# Patient Record
Sex: Female | Born: 2000 | Race: Black or African American | Hispanic: No | Marital: Single | Smoking: Never smoker
Health system: Southern US, Community
[De-identification: ages and names within clinical notes are randomized; demographics above are authoritative.]

---
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
## 2022-01-31 ENCOUNTER — Emergency Department (HOSPITAL_COMMUNITY): Payer: Medicaid Other

## 2022-01-31 ENCOUNTER — Emergency Department (HOSPITAL_COMMUNITY)
Admission: EM | Admit: 2022-01-31 | Discharge: 2022-01-31 | Disposition: A | Discharge status: Home / Self Care | Payer: Medicaid Other | Attending: Emergency Medicine | Admitting: Emergency Medicine

## 2022-01-31 ENCOUNTER — Encounter (HOSPITAL_COMMUNITY): Payer: Self-pay

## 2022-01-31 ENCOUNTER — Other Ambulatory Visit: Payer: Self-pay

## 2022-01-31 DIAGNOSIS — R079 Chest pain, unspecified: Secondary | ICD-10-CM

## 2022-01-31 DIAGNOSIS — R0789 Other chest pain: Secondary | ICD-10-CM | POA: Insufficient documentation

## 2022-01-31 MED ORDER — IBUPROFEN 800 MG PO TABS
800.0000 mg | ORAL_TABLET | Freq: Once | ORAL | Status: AC
  Administered 2022-01-31: 800 mg via ORAL
  Filled 2022-01-31: qty 1

## 2022-01-31 NOTE — ED Provider Triage Note (Signed)
Emergency Medicine Provider Triage Evaluation Note  Nancy Hoffman , a 21 y.o. female  was evaluated in triage.  Pt complains of pain in her right chest   Review of Systems  Positive: pain Negative:fever  Physical Exam  BP 113/76   Pulse 69   Temp 98.2 F (36.8 C) (Oral)   Resp 17   Ht 5\' 8"  (1.727 m)   Wt 104.5 kg   LMP 01/29/2022   SpO2 91%   BMI 35.02 kg/m  Gen:   Awake, no distress   Resp:  Normal effort  MSK:   Moves extremities without difficulty  Other:    Medical Decision Making  Medically screening exam initiated at 6:59 PM.  Appropriate orders placed.  03/31/2022 Nancy Hoffman was informed that the remainder of the evaluation will be completed by another provider, this initial triage assessment does not replace that evaluation, and the importance of remaining in the ED until their evaluation is complete.     Ella Jubilee, Elson Areas 01/31/22 1900

## 2022-01-31 NOTE — Discharge Instructions (Signed)
Today you were evaluated for chest pain.  Your EKG was stable with no signs or symptoms of arrhythmias or heart attacks.  Your chest x-ray looked well with no signs of collapsed lung, masses, or infection.  Your lungs sounded clear on exam with no wheezing or signs of asthma.  You are low risk for a blood clot and are not recommended for further testing at this time.  Please follow-up with your primary care physician or cardiologist provided if symptoms do not improve in the next 3 to 5 days.  Attempt taking Motrin at home to see if it improves your symptoms.

## 2022-01-31 NOTE — ED Provider Notes (Signed)
[facility]-EMERGENCY DEPT Provider Note   CSN: [id number] Arrival date & time: 01/31/22  1647     History  Chief Complaint  Patient presents with   Chest Pain   Shortness of Breath    Nancy Hoffman is a 21 y.o. female.  Patient is a 21 year old female presenting for complaints of chest pain.  Patient admits to chest pain that she noticed upon waking this morning, located on the left chest border, radiating to the left upper extremity, with no stated shortness of breath.  Denies any fevers, chills, coughing.  Denies any previous history of DVT or PE, lower extremity swelling or calf tenderness, prolonged immobilization in the past 3 months, recent surgeries, or hormone therapy.  Denies any chest rashes or breast masses.  The history is provided by the patient. No language interpreter was used.  Chest Pain Associated symptoms: shortness of breath   Associated symptoms: no abdominal pain, no back pain, no cough, no fever, no palpitations and no vomiting   Shortness of Breath Associated symptoms: chest pain   Associated symptoms: no abdominal pain, no cough, no ear pain, no fever, no rash, no sore throat and no vomiting        Home Medications Prior to Admission medications   Not on File      Allergies    Patient has no known allergies.    Review of Systems   Review of Systems  Constitutional:  Negative for chills and fever.  HENT:  Negative for ear pain and sore throat.   Eyes:  Negative for pain and visual disturbance.  Respiratory:  Positive for shortness of breath. Negative for cough.   Cardiovascular:  Positive for chest pain. Negative for palpitations.  Gastrointestinal:  Negative for abdominal pain and vomiting.  Genitourinary:  Negative for dysuria and hematuria.  Musculoskeletal:  Negative for arthralgias and back pain.  Skin:  Negative for color change and rash.  Neurological:  Negative for seizures and syncope.  All other systems  reviewed and are negative.   Physical Exam Updated Vital Signs BP 116/71   Pulse 78   Temp 98.4 F (36.9 C) (Oral)   Resp 18   Ht 5\' 8"  (1.727 m)   Wt 104.5 kg   LMP 01/29/2022   SpO2 100%   BMI 35.02 kg/m  Physical Exam Vitals and nursing note reviewed.  Constitutional:      General: She is not in acute distress.    Appearance: She is well-developed.  HENT:     Head: Normocephalic and atraumatic.  Eyes:     Conjunctiva/sclera: Conjunctivae normal.  Cardiovascular:     Rate and Rhythm: Normal rate and regular rhythm.     Heart sounds: No murmur heard. Pulmonary:     Effort: Pulmonary effort is normal. No respiratory distress.     Breath sounds: Normal breath sounds.  Chest:     Chest wall: No mass or tenderness.  Abdominal:     Palpations: Abdomen is soft.     Tenderness: There is no abdominal tenderness.  Musculoskeletal:        General: No swelling.     Cervical back: Neck supple.  Skin:    General: Skin is warm and dry.     Capillary Refill: Capillary refill takes less than 2 seconds.     Findings: No rash.  Neurological:     Mental Status: She is alert and oriented to person, place, and time.     GCS: GCS  eye subscore is 4. GCS verbal subscore is 5. GCS motor subscore is 6.  Psychiatric:        Mood and Affect: Mood normal.     ED Results / Procedures / Treatments   Labs (all labs ordered are listed, but only abnormal results are displayed) Labs Reviewed  HCG, SERUM, QUALITATIVE    EKG EKG Interpretation  Date/Time:  Thursday January 31 2022 18:22:29 EDT Ventricular Rate:  74 PR Interval:  165 QRS Duration: 70 QT Interval:  396 QTC Calculation: 440 R Axis:   76 Text Interpretation: Sinus rhythm RSR' in V1 or V2, probably normal variant Borderline T wave abnormalities Confirmed by Edwin Dada ([id number]) on 01/31/2022 10:16:13 PM  Radiology DG Chest 2 View  Result Date: 01/31/2022 CLINICAL DATA:  Chest pain EXAM: CHEST - 2 VIEW COMPARISON:   08/17/2021 FINDINGS: The heart size and mediastinal contours are within normal limits. Both lungs are clear. The visualized skeletal structures are unremarkable. IMPRESSION: No active cardiopulmonary disease. Electronically Signed   By: Jasmine Pang M.D.   On: 01/31/2022 19:15    Procedures Procedures    Medications Ordered in ED Medications  ibuprofen (ADVIL) tablet 800 mg (800 mg Oral Given 01/31/22 2240)    ED Course/ Medical Decision Making/ A&P                           Medical Decision Making Amount and/or Complexity of Data Reviewed Labs: ordered.  Risk Prescription drug management.   56:46 PM  21 year old female presenting for complaints of chest pain.  Patient is resting comfortably in bed, afebrile, stable vital signs.  EKG as interpreted by myself demonstrates sinus rhythm.  No ST segment elevation or depression.  No T wave inversions.  Normal axis.  QTc 440.  Past x-ray demonstrates no pneumothorax.  No pneumonia.  Normal mediastinum with.  Normal heart borders.  Chest wall demonstrates no rashes.  No reproducible tenderness.  Patient PERC negative.  Doubt PE.  Motrin given. Pt declined pregnancy test stating she has not has sexual intercourse yet.  At this time I have low suspicion for acute any acute life-threatening etiologies of chest pain.  Patient recommended for close follow-up with primary care physician and/or cardiologist if symptoms do not improve.  Physician provided.  Patient in no distress and overall condition improved here in the ED. Detailed discussions were had with the patient regarding current findings, and need for close f/u with PCP or on call doctor. The patient has been instructed to return immediately if the symptoms worsen in any way for re-evaluation. Patient verbalized understanding and is in agreement with current care plan. All questions answered prior to discharge.         Final Clinical Impression(s) / ED Diagnoses Final diagnoses:   Chest pain, unspecified type    Rx / DC Orders ED Discharge Orders     None         Franne Forts, DO 01/31/22 2259

## 2022-01-31 NOTE — ED Triage Notes (Signed)
Patient states she began having constant  mid chest pain this AM with SOB and left ar

## 2023-01-15 IMAGING — CR DG CHEST 2V
2 series · 2 of 2 positions shown · non-contrast
Comparison: None.

CLINICAL DATA: Chest pain

EXAM:
CHEST - 2 VIEW

[chest pa]
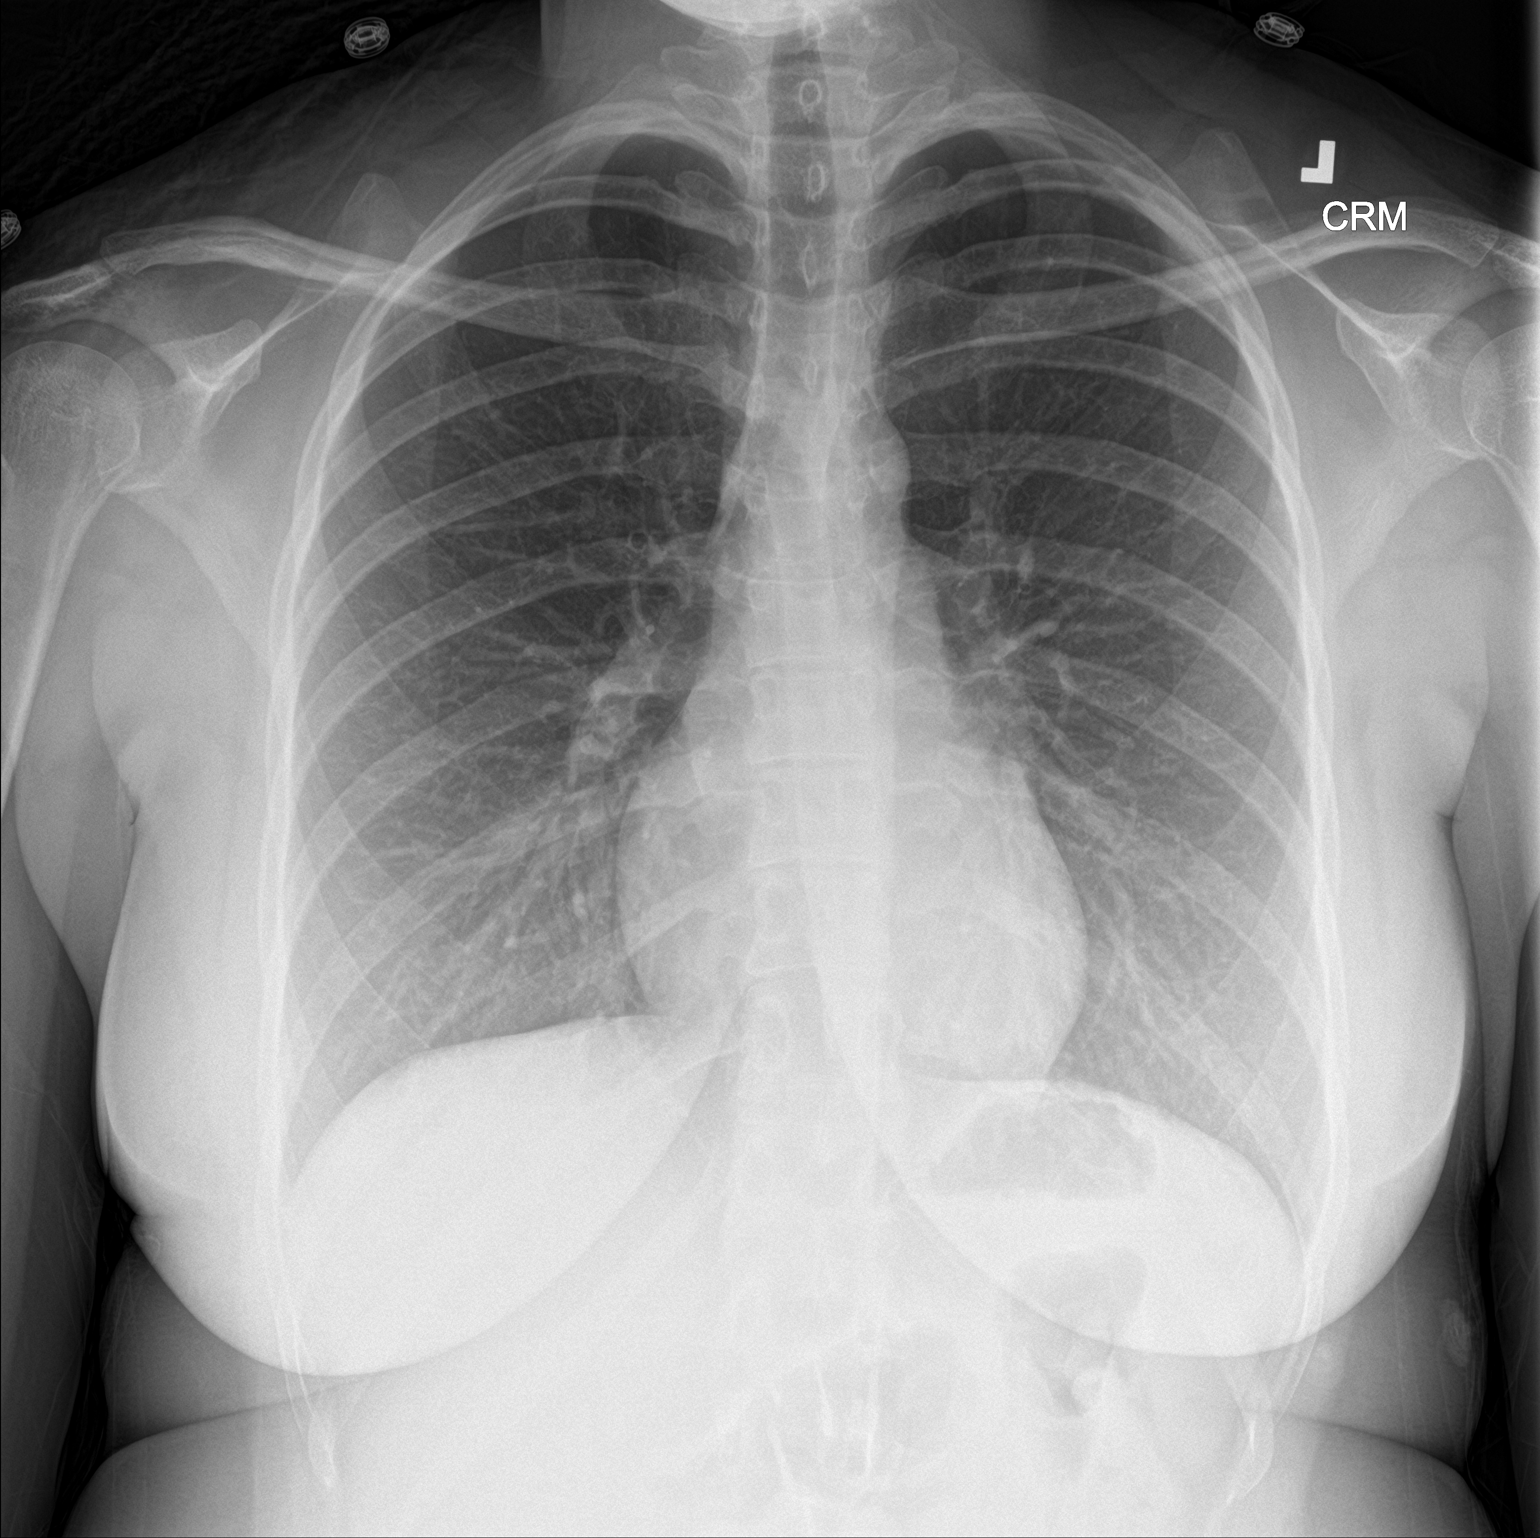

[chest lat]
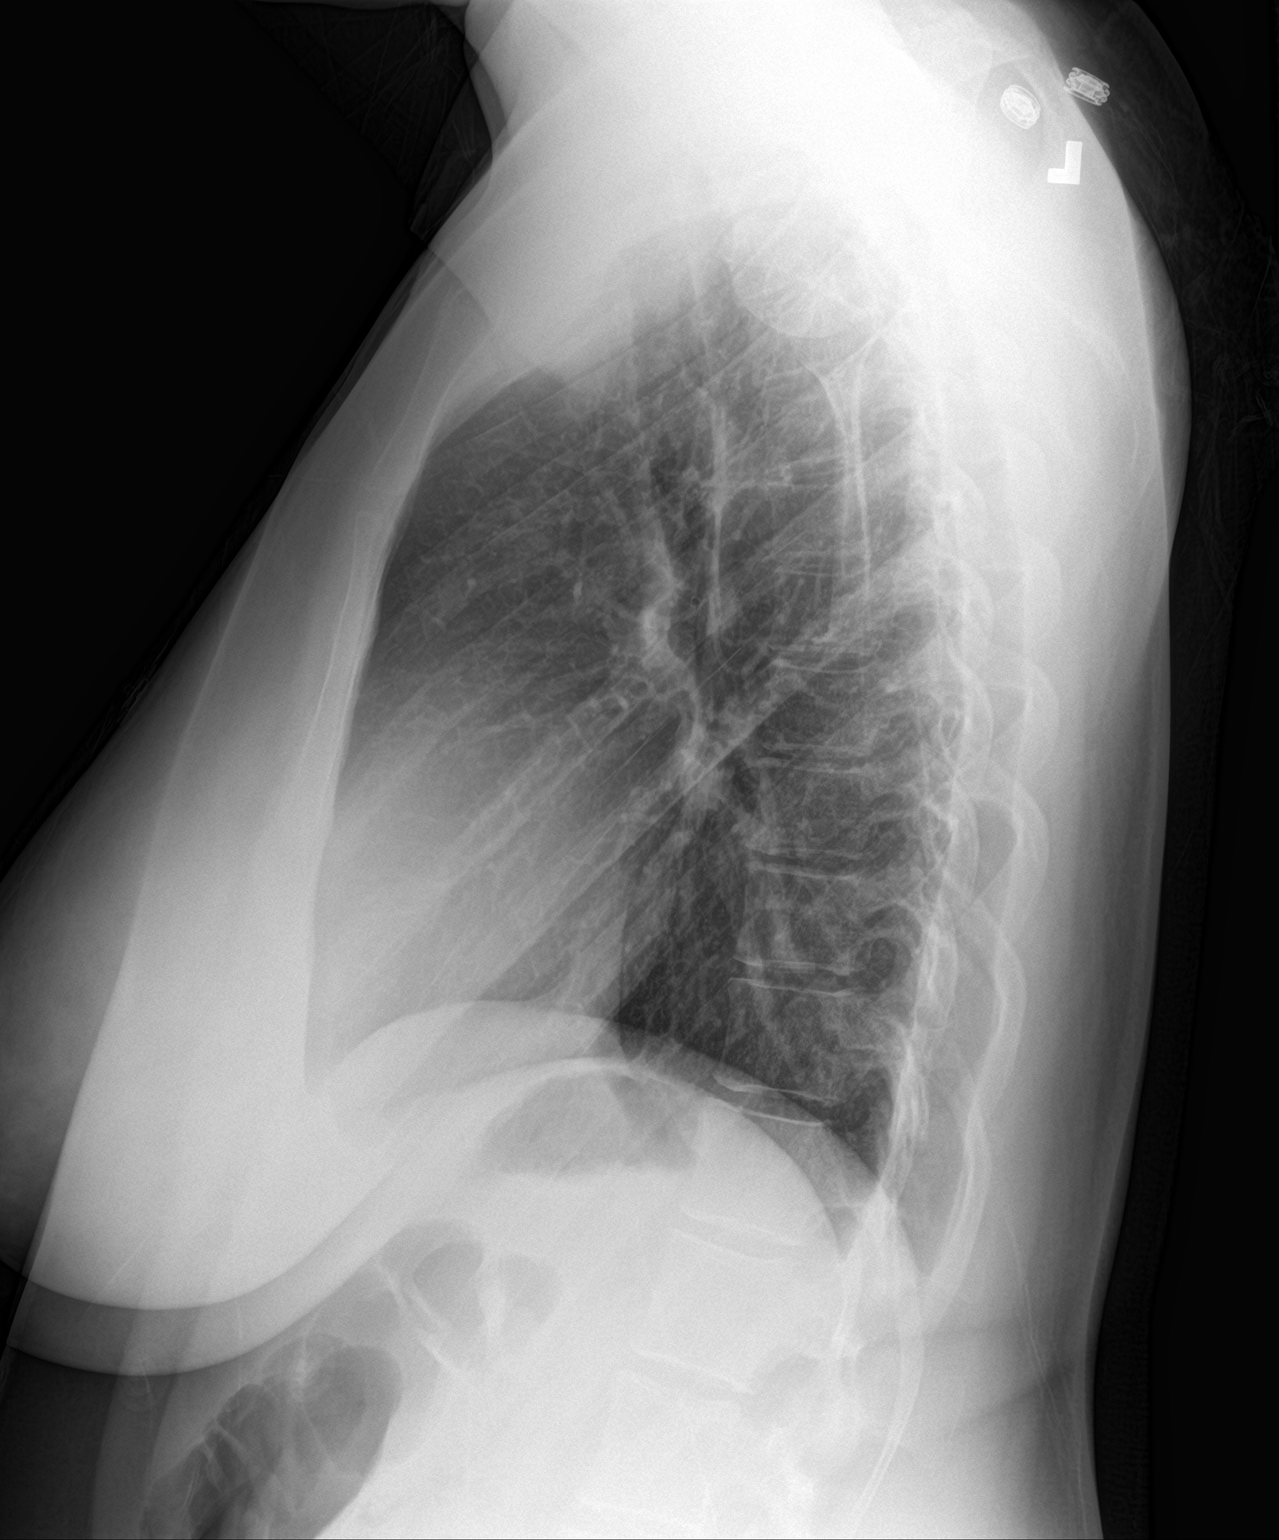

[2 of 2 positions shown; findings below may reference images not displayed]

FINDINGS: The heart size and mediastinal contours are within normal limits.
Both lungs are clear. The visualized skeletal structures are
unremarkable.
IMPRESSION: Normal study.

## 2023-07-02 ENCOUNTER — Encounter (HOSPITAL_COMMUNITY): Payer: Self-pay | Admitting: *Deleted

## 2023-07-02 ENCOUNTER — Emergency Department (HOSPITAL_COMMUNITY)
Admission: EM | Admit: 2023-07-02 | Discharge: 2023-07-03 | Disposition: A | Discharge status: Home / Self Care | Payer: Medicaid Other | Attending: Emergency Medicine | Admitting: Emergency Medicine

## 2023-07-02 ENCOUNTER — Emergency Department (HOSPITAL_COMMUNITY): Payer: Medicaid Other

## 2023-07-02 DIAGNOSIS — R079 Chest pain, unspecified: Secondary | ICD-10-CM | POA: Diagnosis present

## 2023-07-02 DIAGNOSIS — R072 Precordial pain: Secondary | ICD-10-CM | POA: Insufficient documentation

## 2023-07-02 LAB — BASIC METABOLIC PANEL
Anion gap: 9 (ref 5–15)
BUN: 7 mg/dL (ref 6–20)
CO2: 24 mmol/L (ref 22–32)
Calcium: 9.1 mg/dL (ref 8.9–10.3)
Chloride: 106 mmol/L (ref 98–111)
Creatinine, Ser: 0.85 mg/dL (ref 0.44–1.00)
GFR, Estimated: >60 mL/min (ref >60)
Glucose, Bld: 107 mg/dL — ABNORMAL HIGH (ref 70–99)
Potassium: 3.9 mmol/L (ref 3.5–5.1)
Sodium: 139 mmol/L (ref 135–145)

## 2023-07-02 LAB — CBC
HCT: 39.2 % (ref 36.0–46.0)
Hemoglobin: 13.1 g/dL (ref 12.0–15.0)
MCH: 29.7 pg (ref 26.0–34.0)
MCHC: 33.4 g/dL (ref 30.0–36.0)
MCV: 88.9 fL (ref 80.0–100.0)
Platelets: 339 10*3/uL (ref 150–400)
RBC: 4.41 MIL/uL (ref 3.87–5.11)
RDW: 12.5 % (ref 11.5–15.5)
WBC: 11.6 10*3/uL — ABNORMAL HIGH (ref 4.0–10.5)
nRBC: 0 % (ref 0.0–0.2)

## 2023-07-02 LAB — TROPONIN I (HIGH SENSITIVITY): Troponin I (High Sensitivity): <2 ng/L (ref <18)

## 2023-07-02 LAB — HCG, SERUM, QUALITATIVE: Preg, Serum: NEGATIVE

## 2023-07-02 NOTE — ED Triage Notes (Signed)
 Chest pain since this am  hx of the same worked up with no diagnosis   also dizziness and sob  lmp one month ago

## 2023-07-03 ENCOUNTER — Encounter (HOSPITAL_COMMUNITY): Payer: Self-pay | Admitting: Emergency Medicine

## 2023-07-03 ENCOUNTER — Emergency Department (HOSPITAL_COMMUNITY): Payer: Medicaid Other

## 2023-07-03 MED ORDER — IOHEXOL 350 MG/ML SOLN
75.0000 mL | Freq: Once | INTRAVENOUS | Status: AC | PRN
  Administered 2023-07-03: 75 mL via INTRAVENOUS

## 2023-07-03 NOTE — ED Notes (Signed)
 PA said 2nd trop not needed

## 2023-07-03 NOTE — Discharge Instructions (Signed)
 You were seen in the emergency department today for chest pain.  As we discussed your lab work, EKG, chest x-ray, and CT of your chest all looked reassuring today.   I recommend monitoring your stress levels.  Continue to monitor how you are doing overall, and return to the emergency department for any new or worsening symptoms such as: Worsening pain or pain with exertion, difficulty breathing, sweating, or pain or swelling in your legs.  I recommend following up closely with your pcp as well.   Return if development of any new or worsening symptoms.

## 2023-07-03 NOTE — ED Provider Notes (Signed)
 [facility] EMERGENCY DEPARTMENT AT [facility] Provider Note   CSN: [id number] Arrival date & time: 07/02/23  2146     History  Chief Complaint  Patient presents with   Chest Pain    Nancy Hoffman is a 23 y.o. female.  Patient with noncontributory past medical history presents today with complaints of chest pain.  She states that same began yesterday morning when she woke up.  The pain feels like pressure in the middle of her chest and does not radiate.  She notes associated lightheadedness as well as shortness of breath. States that she has waves where she feels like she is going to pass out and becomes very diaphoretic and lightheaded.  These episodes seem to occur without a discernible trigger.  She does note that she has had a history of similar episodes previously and has been seen and evaluated without any acute findings.  She states that this episode seems worse.  She denies any recent travel or recent surgeries.  No leg pain or leg swelling.  She is not on OCPs. No history of blood clots or malignancy. No fevers, chills, cough, or congestion.  The history is provided by the patient. No language interpreter was used.  Chest Pain Associated symptoms: shortness of breath        Home Medications Prior to Admission medications   Not on File      Allergies    Patient has no known allergies.    Review of Systems   Review of Systems  Respiratory:  Positive for shortness of breath.   Cardiovascular:  Positive for chest pain.  All other systems reviewed and are negative.   Physical Exam Updated Vital Signs BP 115/73   Pulse 87   Temp 98.5 F (36.9 C)   Resp 16   Ht 5' 8 (1.727 m)   Wt 104.5 kg   LMP 06/02/2023   SpO2 100%   BMI 35.03 kg/m  Physical Exam Vitals and nursing note reviewed.  Constitutional:      General: She is not in acute distress.    Appearance: Normal appearance. She is normal weight. She is not ill-appearing, toxic-appearing or  diaphoretic.  HENT:     Head: Normocephalic and atraumatic.  Cardiovascular:     Rate and Rhythm: Normal rate and regular rhythm.     Heart sounds: Normal heart sounds.  Pulmonary:     Effort: Pulmonary effort is normal. No respiratory distress.     Breath sounds: Normal breath sounds.  Chest:     Chest wall: Tenderness present.     Comments: TTP midsternal chest area. No deformity or overlying skin changes.  Abdominal:     Palpations: Abdomen is soft.     Tenderness: There is no abdominal tenderness.  Musculoskeletal:        General: Normal range of motion.     Cervical back: Normal range of motion.     Right lower leg: No tenderness. No edema.     Left lower leg: No tenderness. No edema.  Skin:    General: Skin is warm and dry.  Neurological:     General: No focal deficit present.     Mental Status: She is alert.  Psychiatric:        Mood and Affect: Mood normal.        Behavior: Behavior normal.     ED Results / Procedures / Treatments   Labs (all labs ordered are listed, but only abnormal results  are displayed) Labs Reviewed  BASIC METABOLIC PANEL - Abnormal; Notable for the following components:      Result Value   Glucose, Bld 107 (*)    All other components within normal limits  CBC - Abnormal; Notable for the following components:   WBC 11.6 (*)    All other components within normal limits  HCG, SERUM, QUALITATIVE  TROPONIN I (HIGH SENSITIVITY)  TROPONIN I (HIGH SENSITIVITY)    EKG EKG Interpretation Date/Time:  Wednesday July 02 2023 22:02:27 EST Ventricular Rate:  98 PR Interval:  138 QRS Duration:  72 QT Interval:  338 QTC Calculation: 431 R Axis:   78  Text Interpretation: Normal sinus rhythm T wave abnormality, consider inferior ischemia Abnormal ECG When compared with ECG of 31-Jan-2022 18:22, Nonspecific T wave abnormality is now present Confirmed by Raford Lenis ([id number]) on 07/03/2023 2:45:57 AM  Radiology DG Chest 2 View Result Date:  07/02/2023 CLINICAL DATA:  Chest pain since this morning. Passed out this morning. Left upper chest pain. EXAM: CHEST - 2 VIEW COMPARISON:  01/31/2022 FINDINGS: The heart size and mediastinal contours are within normal limits. Both lungs are clear. The visualized skeletal structures are unremarkable. IMPRESSION: No active cardiopulmonary disease. Electronically Signed   By: Elsie Gravely M.D.   On: 07/02/2023 22:21    Procedures Procedures    Medications Ordered in ED Medications - No data to display  ED Course/ Medical Decision Making/ A&P                                 Medical Decision Making Amount and/or Complexity of Data Reviewed Radiology: ordered.  Risk Prescription drug management.   This patient is a 23 y.o. female who presents to the ED for concern of chest pain, shortness of breath, lightheadedness, this involves an extensive number of treatment options, and is a complaint that carries with it a high risk of complications and morbidity. The emergent differential diagnosis prior to evaluation includes, but is not limited to,  CHF, pericardial effusion/tamponade, arrhythmias, ACS, COPD, asthma, bronchitis, pneumonia, pneumothorax, PE, anemia   This is not an exhaustive differential.   Past Medical History / Co-morbidities / Social History:  has no past medical history on file.  Additional history: Chart reviewed. Pertinent results include: several visits for nonspecific chest pain intermittently over the past few years with normal labs, CXR, and EKG all resulted in d/c  Physical Exam: Physical exam performed. The pertinent findings include: no acute physical exam abnormalities  Lab Tests: I ordered, and personally interpreted labs.  The pertinent results include:  WBC 11.6, troponin <2   Imaging Studies: I ordered imaging studies including CXR, CTA PE. I independently visualized and interpreted imaging which showed   CXR: NAD  CT:   No acute vascular or  nonvascular intrathoracic abnormality.   Specifically, no segmental or larger pulmonary embolus.  I agree with the radiologist interpretation.   Cardiac Monitoring:  The patient was maintained on a cardiac monitor.  Cardiac monitor showed an underlying rhythm of: sinus rhythm, no STEMI + [id number]. I agree with this interpretation.   Disposition: After consideration of the diagnostic results and the patients response to treatment, I feel that emergency department workup does not suggest an emergent condition requiring admission or immediate intervention beyond what has been performed at this time. The plan is: discharge with close outpatient follow-up and return precautions. Patients work-up is benign and she is  low risk age category with no comorbid medical problems. She is speaking in complete sentences without any dyspnea and is able to walk around without sob or lightheadedness. Evaluation and diagnostic testing in the emergency department does not suggest an emergent condition requiring admission or immediate intervention beyond what has been performed at this time.  Plan for discharge with close PCP follow-up.  Patient is understanding and amenable with plan, educated on red flag symptoms that would prompt immediate return.  Patient discharged in stable condition.  Final Clinical Impression(s) / ED Diagnoses Final diagnoses:  Precordial chest pain    Rx / DC Orders ED Discharge Orders     None     An After Visit Summary was printed and given to the patient.     Nora Lauraine DELENA DEVONNA 07/03/23 1103    Randol Simmonds, MD 07/04/23 [phone]
